# Patient Record
Sex: Male | Born: 1935 | Race: White | Hispanic: No | Marital: Married
Health system: Southern US, Community
[De-identification: ages and names within clinical notes are randomized; demographics above are authoritative.]

---

## 2021-09-11 ENCOUNTER — Emergency Department
Admission: EM | Admit: 2021-09-11 | Discharge: 2021-09-11 | Disposition: A | Discharge status: Home / Self Care | Payer: Medicare Other | Attending: Emergency Medicine | Admitting: Emergency Medicine

## 2021-09-11 ENCOUNTER — Other Ambulatory Visit: Payer: Self-pay

## 2021-09-11 ENCOUNTER — Emergency Department: Payer: Medicare Other

## 2021-09-11 DIAGNOSIS — S46912A Strain of unspecified muscle, fascia and tendon at shoulder and upper arm level, left arm, initial encounter: Secondary | ICD-10-CM

## 2021-09-11 DIAGNOSIS — Y92410 Unspecified street and highway as the place of occurrence of the external cause: Secondary | ICD-10-CM | POA: Diagnosis not present

## 2021-09-11 DIAGNOSIS — S4992XA Unspecified injury of left shoulder and upper arm, initial encounter: Secondary | ICD-10-CM | POA: Diagnosis present

## 2021-09-11 DIAGNOSIS — S8001XA Contusion of right knee, initial encounter: Secondary | ICD-10-CM

## 2021-09-11 DIAGNOSIS — S46812A Strain of other muscles, fascia and tendons at shoulder and upper arm level, left arm, initial encounter: Secondary | ICD-10-CM | POA: Diagnosis not present

## 2021-09-11 MED ORDER — PREDNISONE 50 MG PO TABS: 50.0000 mg | ORAL_TABLET | Freq: Every day | ORAL | 0 refills | Status: AC

## 2021-09-11 MED ORDER — METHOCARBAMOL 500 MG PO TABS: 500.0000 mg | ORAL_TABLET | Freq: Four times a day (QID) | ORAL | 0 refills | Status: AC
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
## 2021-09-11 NOTE — ED Notes (Signed)
Patient resting comfortably in the chair at the bedside. RR even and unlabored. Patient verbalizes no needs or complaints at this time. Patient updated on wait time to disposition. ?
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
